# Patient Record
Sex: Female | Born: 1975 | Race: Black or African American | Hispanic: No | Smoking: Former smoker
Health system: Southern US, Community
[De-identification: ages and names within clinical notes are randomized; demographics above are authoritative.]

## PROBLEM LIST (undated history)

## (undated) DIAGNOSIS — I10 Essential (primary) hypertension: Secondary | ICD-10-CM

---

## 2018-12-07 ENCOUNTER — Emergency Department
Admission: EM | Admit: 2018-12-07 | Discharge: 2018-12-07 | Disposition: A | Discharge status: Home / Self Care | Payer: Federal, State, Local not specified - PPO | Attending: Emergency Medicine | Admitting: Emergency Medicine

## 2018-12-07 ENCOUNTER — Encounter: Payer: Self-pay | Admitting: Emergency Medicine

## 2018-12-07 ENCOUNTER — Other Ambulatory Visit: Payer: Self-pay

## 2018-12-07 DIAGNOSIS — E876 Hypokalemia: Secondary | ICD-10-CM | POA: Insufficient documentation

## 2018-12-07 DIAGNOSIS — R42 Dizziness and giddiness: Secondary | ICD-10-CM | POA: Insufficient documentation

## 2018-12-07 DIAGNOSIS — Z87891 Personal history of nicotine dependence: Secondary | ICD-10-CM | POA: Diagnosis not present

## 2018-12-07 DIAGNOSIS — I10 Essential (primary) hypertension: Secondary | ICD-10-CM | POA: Diagnosis not present

## 2018-12-07 HISTORY — DX: Essential (primary) hypertension: I10

## 2018-12-07 LAB — CBC WITH DIFFERENTIAL/PLATELET
Abs Immature Granulocytes: 0.02 10*3/uL (ref 0.00–0.07)
Basophils Absolute: 0.1 10*3/uL (ref 0.0–0.1)
Basophils Relative: 1 %
EOS PCT: 3 %
Eosinophils Absolute: 0.2 10*3/uL (ref 0.0–0.5)
HCT: 35.1 % — ABNORMAL LOW (ref 36.0–46.0)
Hemoglobin: 11.3 g/dL — ABNORMAL LOW (ref 12.0–15.0)
Immature Granulocytes: 0 %
Lymphocytes Relative: 37 %
Lymphs Abs: 2.7 10*3/uL (ref 0.7–4.0)
MCH: 28.5 pg (ref 26.0–34.0)
MCHC: 32.2 g/dL (ref 30.0–36.0)
MCV: 88.4 fL (ref 80.0–100.0)
Monocytes Absolute: 0.6 10*3/uL (ref 0.1–1.0)
Monocytes Relative: 8 %
Neutro Abs: 3.8 10*3/uL (ref 1.7–7.7)
Neutrophils Relative %: 51 %
Platelets: 231 10*3/uL (ref 150–400)
RBC: 3.97 MIL/uL (ref 3.87–5.11)
RDW: 14.4 % (ref 11.5–15.5)
WBC: 7.5 10*3/uL (ref 4.0–10.5)
nRBC: 0 % (ref 0.0–0.2)

## 2018-12-07 LAB — COMPREHENSIVE METABOLIC PANEL
ALBUMIN: 3.6 g/dL (ref 3.5–5.0)
ALT: 13 U/L (ref 0–44)
AST: 32 U/L (ref 15–41)
Alkaline Phosphatase: 82 U/L (ref 38–126)
Anion gap: 8 (ref 5–15)
BUN: 13 mg/dL (ref 6–20)
CHLORIDE: 100 mmol/L (ref 98–111)
CO2: 28 mmol/L (ref 22–32)
Calcium: 8.4 mg/dL — ABNORMAL LOW (ref 8.9–10.3)
Creatinine, Ser: 0.87 mg/dL (ref 0.44–1.00)
GFR calc non Af Amer: >60 mL/min (ref >60)
Glucose, Bld: 130 mg/dL — ABNORMAL HIGH (ref 70–99)
Potassium: 3.3 mmol/L — ABNORMAL LOW (ref 3.5–5.1)
SODIUM: 136 mmol/L (ref 135–145)
Total Bilirubin: 0.8 mg/dL (ref 0.3–1.2)
Total Protein: 7.1 g/dL (ref 6.5–8.1)

## 2018-12-07 LAB — TROPONIN I

## 2018-12-07 LAB — MAGNESIUM: Magnesium: 2.2 mg/dL (ref 1.7–2.4)

## 2018-12-07 MED ORDER — POTASSIUM CHLORIDE CRYS ER 20 MEQ PO TBCR
40.0000 meq | EXTENDED_RELEASE_TABLET | Freq: Once | ORAL | Status: AC
  Administered 2018-12-07: 40 meq via ORAL
  Filled 2018-12-07: qty 2

## 2018-12-07 NOTE — ED Provider Notes (Signed)
[facility] Emergency Department Provider Note  ____________________________________________   First MD Initiated Contact with Patient 12/07/18 [phone]     (approximate)  I have reviewed the triage vital signs and the nursing notes.   HISTORY  Chief Complaint Dizziness    HPI Melissa Wilcox is a 42 y.o. female with a history of hypertension and prior episodes of dizziness that she says are secondary to low potassium.  She presents tonight by EMS for the same.  She states that she got up to go to the bathroom and felt very lightheaded.  In the past when this is happened she has passed out and even lost control of her bladder during the "falling out" episode.  She has been seen by her regular doctor multiple times and was told that she needs to keep a close eye on her potassium.  She takes HCTZ and takes a daily potassium supplement of 20 mEq a day with sometimes it still gets low in spite of that.  She says that her symptoms are mild at this time but she did not wanted to get any worse.  She denies headache, neck pain, shortness of breath, nausea, vomiting, fever/chills, chest pain, and abdominal pain.  Nothing in particular makes her symptoms better or worse.  She does not feel off balance at this time and states that she actually feels much better than she did earlier.  She has had no numbness nor weakness in her extremities.  Past Medical History:  Diagnosis Date  . Hypertension     There are no active problems to display for this patient.   History reviewed. No pertinent surgical history.  Prior to Admission medications   Not on File    Allergies Patient has no known allergies.  No family history on file.  Social History Social History   Tobacco Use  . Smoking status: Former Games developer  . Smokeless tobacco: Never Used  Substance Use Topics  . Alcohol use: Not on file  . Drug use: Not on file    Review of Systems Constitutional: No  fever/chills Eyes: No visual changes. ENT: No sore throat. Cardiovascular: Denies chest pain. Respiratory: Denies shortness of breath. Gastrointestinal: No abdominal pain.  No nausea, no vomiting.  No diarrhea.  No constipation. Genitourinary: Negative for dysuria. Musculoskeletal: Negative for neck pain.  Negative for back pain. Integumentary: Negative for rash. Neurological: Lightheadedness, now resolved.  Negative for headaches, focal weakness or numbness.   ____________________________________________   PHYSICAL EXAM:  VITAL SIGNS: ED Triage Vitals [12/07/18 0144]  Enc Vitals Group     BP (!) 125/57     Pulse Rate 67     Resp 18     Temp 98 F (36.7 C)     Temp Source Oral     SpO2 100 %     Weight 98.9 kg (218 lb)     Height 1.702 m (5\' 7" )     Head Circumference      Peak Flow      Pain Score 0     Pain Loc      Pain Edu?      Excl. in GC?     Constitutional: Alert and oriented. Well appearing and in no acute distress. Eyes: Conjunctivae are normal.  Head: Atraumatic. Nose: No congestion/rhinnorhea. Mouth/Throat: Mucous membranes are moist. Neck: No stridor.  No meningeal signs.   Cardiovascular: Normal rate, regular rhythm. Good peripheral circulation. Grossly normal heart sounds. Respiratory: Normal respiratory effort.  No  retractions. Lungs CTAB. Gastrointestinal: Soft and nontender. No distention.  Musculoskeletal: No lower extremity tenderness nor edema. No gross deformities of extremities. Neurologic:  Normal speech and language. No gross focal neurologic deficits are appreciated.  Negative Romberg, no pronator drift, ambulatory without difficulty and with steady gait, normal finger-to-nose testing. Skin:  Skin is warm, dry and intact. No rash noted. Psychiatric: Mood and affect are normal. Speech and behavior are normal.  ____________________________________________   LABS (all labs ordered are listed, but only abnormal results are displayed)  Labs  Reviewed  CBC WITH DIFFERENTIAL/PLATELET - Abnormal; Notable for the following components:      Result Value   Hemoglobin 11.3 (*)    HCT 35.1 (*)    All other components within normal limits  COMPREHENSIVE METABOLIC PANEL - Abnormal; Notable for the following components:   Potassium 3.3 (*)    Glucose, Bld 130 (*)    Calcium 8.4 (*)    All other components within normal limits  TROPONIN I  MAGNESIUM   ____________________________________________  EKG  ED ECG REPORT I, Loleta Roseory Shonta Phillis, the attending physician, personally viewed and interpreted this ECG.  Date: 12/07/2018 EKG Time: 1:44 AM Rate: 61 Rhythm: normal sinus rhythm QRS Axis: normal Intervals: normal ST/T Wave abnormalities: Non-specific ST segment / T-wave changes, but no evidence of acute ischemia. Narrative Interpretation: no evidence of acute ischemia   ____________________________________________  RADIOLOGY   ED MD interpretation: No indication for imaging  Official radiology report(s): No results found.  ____________________________________________   PROCEDURES  Critical Care performed: No   Procedure(s) performed:   Procedures   ____________________________________________   INITIAL IMPRESSION / ASSESSMENT AND PLAN / ED COURSE  As part of my medical decision making, I reviewed the following data within the electronic MEDICAL RECORD NUMBER Nursing notes reviewed and incorporated, Labs reviewed , EKG interpreted  and Old chart reviewed    Differential diagnosis includes, but is not limited to, electrolyte abnormality, volume depletion, CVA/TIA, acute infection.  The patient is asymptomatic at this time and her symptoms were only mild earlier.  She was concerned because of her history and she is correct that her potassium is a little bit low.  Although I am uncertain that this is the cause of her transient lightheadedness, she is neurologically intact and has a reassuring exam overall.  She is  comfortable with the plan to replete her potassium with 40 mEq by mouth and will continue taking her regular regimen at home.  I encouraged her to follow-up with her PCP at the next available opportunity and gave my usual customary return precautions.  Of note her CBC and magnesium levels were normal.     ____________________________________________  FINAL CLINICAL IMPRESSION(S) / ED DIAGNOSES  Final diagnoses:  Dizziness  Hypokalemia     MEDICATIONS GIVEN DURING THIS VISIT:  Medications  potassium chloride SA (K-DUR,KLOR-CON) CR tablet 40 mEq (has no administration in time range)     ED Discharge Orders    None       Note:  This document was prepared using Dragon voice recognition software and may include unintentional dictation errors.    Loleta RoseForbach, Makalah Asberry, MD 12/07/18 [phone]

## 2018-12-07 NOTE — ED Triage Notes (Signed)
Pt to triage via w/c with no distress noted, brought in by EMS; pt reports onset dizziness PTA; st hx of same with hypokalemia; pt denies c/o at present

## 2018-12-07 NOTE — Discharge Instructions (Signed)
Your workup in the Emergency Department today was reassuring.  We did not find any specific abnormalities other than a slightly decreased potassium level of 3.3.  We provided an additional potassium supplement (40 meq by mouth) and encourage you to continue taking your regular medications and supplements.  Please follow up with the doctor(s) listed in these documents as recommended.  Return to the Emergency Department if you develop new or worsening symptoms that concern you.

## 2019-09-05 ENCOUNTER — Other Ambulatory Visit: Payer: Self-pay

## 2019-09-05 ENCOUNTER — Ambulatory Visit
Admission: EM | Admit: 2019-09-05 | Discharge: 2019-09-05 | Disposition: A | Discharge status: Home / Self Care | Payer: Federal, State, Local not specified - PPO

## 2019-09-05 DIAGNOSIS — S60559A Superficial foreign body of unspecified hand, initial encounter: Secondary | ICD-10-CM

## 2019-09-05 DIAGNOSIS — W458XXA Other foreign body or object entering through skin, initial encounter: Secondary | ICD-10-CM | POA: Diagnosis not present

## 2019-09-05 MED ORDER — LIDOCAINE HCL (PF) 1 % IJ SOLN
1.0000 mL | Freq: Once | INTRAMUSCULAR | Status: AC
  Administered 2019-09-05: 1 mL

## 2019-09-05 MED ORDER — LIDOCAINE-EPINEPHRINE-TETRACAINE (LET) SOLUTION
3.0000 mL | Freq: Once | NASAL | Status: AC
  Administered 2019-09-05: 3 mL via TOPICAL

## 2019-09-05 NOTE — ED Triage Notes (Signed)
Patient states that she was crawling up her steps and she got a splinter in her finger. Patient reports that she called EMS and they were unable to get it out.

## 2019-09-05 NOTE — ED Provider Notes (Signed)
[place], [place]   Name: Melissa Wilcox DOB: 09-27-1976 MRN: [id number] CSN: [id number] PCP: Johnston Ebbs, MD  Arrival date and time:  09/05/19 1945  Chief Complaint:  Foreign Body in Skin   NOTE: Prior to seeing the patient today, I have reviewed the triage nursing documentation and vital signs. Clinical staff has updated patient's PMH/PSHx, current medication list, and drug allergies/intolerances to ensure comprehensive history available to assist in medical decision making.   History:   HPI: Melissa Wilcox is a 43 y.o. female who presents today with complaints of a retained foreign body in her RIGHT hand. Patient advising that she was carrying her groceries up the steps while holding on to her wooden railing. Patient states, "I run my hand along that rail and got a tree stuck in my hand". Foreign body embedded to the webbing as the base of the 1st digit on her RIGHT hand. Patient reports that she called EMS out to the her to remove the splinter, however they were unable to. Patient states, "I called them to keep from going to the emergency room". Patient very anxious and screaming out in pain. She states, "when they couldn't get it out they told me to come over here". Tetanus vaccination status reviewed with patient. Based on her reports, it is determined that she is up to date on her tetanus prophylaxis.  Past Medical History:  Diagnosis Date  . Hypertension     History reviewed. No pertinent surgical history.  History reviewed. No pertinent family history.  Social History   Tobacco Use  . Smoking status: Former Research scientist (life sciences)  . Smokeless tobacco: Never Used  Substance Use Topics  . Alcohol use: Never    Frequency: Never  . Drug use: Never    There are no active problems to display for this patient.   Home Medications:    Current Meds  Medication Sig  . amLODipine (NORVASC) 5 MG tablet Take by mouth.  . levothyroxine (SYNTHROID) 50 MCG tablet Take by mouth.  .  lisdexamfetamine (VYVANSE) 50 MG capsule Take by mouth.  . spironolactone (ALDACTONE) 25 MG tablet Take 1 tablet by mouth once daily  . tiZANidine (ZANAFLEX) 2 MG tablet Take by mouth.  . valACYclovir (VALTREX) 1000 MG tablet Take by mouth.    Allergies:   Patient has no known allergies.  Review of Systems (ROS): Review of Systems  Constitutional: Negative for chills and fever.  Respiratory: Negative for cough and shortness of breath.   Cardiovascular: Negative for chest pain and palpitations.  Skin: Positive for wound.  Psychiatric/Behavioral: The patient is nervous/anxious.   All other systems reviewed and are negative.    Vital Signs: Today's Vitals   09/05/19 1954 09/05/19 1958 09/05/19 2026  BP:  139/80   Pulse:  68   Resp:  18   Temp:  97.8 F (36.6 C)   TempSrc:  Tympanic   SpO2:  100%   Weight: 220 lb (99.8 kg)    Height: 5\' 7"  (1.702 m)    PainSc: 10-Worst pain ever  0-No pain    Physical Exam: Physical Exam  Constitutional: She is oriented to person, place, and time and well-developed, well-nourished, and in no distress.  HENT:  Head: Normocephalic and atraumatic.  Cardiovascular: Normal rate.  Pulmonary/Chest: Effort normal. No respiratory distress.  Musculoskeletal:     Right hand: She exhibits tenderness. She exhibits normal range of motion, normal two-point discrimination, normal capillary refill, no deformity and no swelling. Normal sensation noted. Normal strength  noted.       Hands:  Neurological: She is alert and oriented to person, place, and time. She has normal sensation.  Skin: Skin is warm and dry. No rash noted.  Psychiatric: Memory, affect and judgment normal. Her mood appears anxious.  Nursing note and vitals reviewed.   Urgent Care Treatments / Results:   LABS: PLEASE NOTE: all labs that were ordered this encounter are listed, however only abnormal results are displayed. Labs Reviewed - No data to display  EKG: -None  RADIOLOGY:  No results found.  PROCEDURES: Foreign Body Removal Performed by: Verlee MonteGray, Anneliese Leblond E, NP Authorized by: Verlee MonteGray, Tangelia Sanson E, NP   Consent:    Consent obtained:  Verbal   Consent given by:  Patient   Risks discussed:  Bleeding, infection, pain and incomplete removal   Alternatives discussed:  Alternative treatment and referral Location:    Location:  Hand   Hand location:  R palm (webbing at base of 1st digit)   Depth:  Intradermal   Tendon involvement:  None Pre-procedure details:    Imaging:  None   Neurovascular status: intact   Anesthesia (see MAR for exact dosages):    Anesthesia method:  Topical application and local infiltration   Topical anesthetic:  LET (ineffective)   Local anesthetic:  Lidocaine 1% WITH epi Procedure type:    Procedure complexity:  Simple Procedure details:    Dissection of underlying tissues: no     Bloodless field: yes     Removal mechanism:  Hemostat   Foreign bodies recovered:  1   Description:  1 cm wooden shard   Intact foreign body removal: yes   Post-procedure details:    Neurovascular status: intact     Confirmation:  No additional foreign bodies on visualization   Skin closure:  None   Dressing:  Antibiotic ointment and adhesive bandage   Patient tolerance of procedure:  Tolerated well, no immediate complications    MEDICATIONS RECEIVED THIS VISIT: Medications  lidocaine-EPINEPHrine-tetracaine (LET) solution (3 mLs Topical Given 09/05/19 2001)  lidocaine (PF) (XYLOCAINE) 1 % injection 1 mL (1 mL Infiltration Given 09/05/19 2025)    PERTINENT CLINICAL COURSE NOTES/UPDATES:   Initial Impression / Assessment and Plan / Urgent Care Course:  Pertinent labs & imaging results that were available during my care of the patient were personally reviewed by me and considered in my medical decision making (see lab/imaging section of note for values and interpretations).  Melissa Wilcox is a 43 y.o. female who presents to [facility] today with  complaints of Foreign Body in Skin   Patient is well appearing overall in clinic today. She does not appear to be in any acute distress. Presenting symptoms (see HPI) and exam as documented above. Patient presents very anxious. We initially had difficulties getting her to allow topical anesthetic to be applied. LET solution applied and allowed to take effect. Let removed and patient advising that she did not feel numb enough to proceed. Patient on the phone with her family citing that she needed someone to talk to through the procedure. Discussed addition of intradermal anesthetic and patient agreed. 1% lidocaine infiltrated into tissue surrounding foreign body. While patient talking on the phone, FB removed. Patient states, "now tell me when you are going to do it". Wound cleansed. There are no additional FBs observed; patient denies pain. Bacitracin applied and wound dressed by nursing. Wound superficial and not felt to require antibiotics at this point. Patient to monitor for signs and  symptoms of infection, which would include increased redness, swelling, streaking, drainage, pain, and the development of a fever. She was advised to keep wound clean and dry, and to apply TAO twice a day for the next few days.   Discussed follow up with primary care physician in 1 week for re-evaluation. I have reviewed the follow up and strict return precautions for any new or worsening symptoms. Patient is aware of symptoms that would be deemed urgent/emergent, and would thus require further evaluation either here or in the emergency department. At the time of discharge, she verbalized understanding and consent with the discharge plan as it was reviewed with her. All questions were fielded by provider and/or clinic staff prior to patient discharge.    Final Clinical Impressions / Urgent Care Diagnoses:   Final diagnoses:  Foreign body of skin of hand, initial encounter    New Prescriptions:  [place] Controlled Substance  Registry consulted? Not Applicable  Meds ordered this encounter  Medications  . lidocaine-EPINEPHrine-tetracaine (LET) solution  . lidocaine (PF) (XYLOCAINE) 1 % injection 1 mL    Recommended Follow up Care:  Patient encouraged to follow up with the following provider within the specified time frame, or sooner as dictated by the severity of her symptoms. As always, she was instructed that for any urgent/emergent care needs, she should seek care either here or in the emergency department for more immediate evaluation.  Follow-up Information    Chetty, Geryl Rankins, MD In 1 week.   Specialty: Family Medicine Why: General reassessment of symptoms if not improving Contact information: [address] [facility] [place] [address] [phone]         NOTE: This note was prepared using Dragon dictation software along with smaller phrase technology. Despite my best ability to proofread, there is the potential that transcriptional errors may still occur from this process, and are completely unintentional.     Verlee Monte, NP 09/05/19 2126

## 2019-09-05 NOTE — Discharge Instructions (Signed)
It was very nice seeing you today in clinic. Thank you for entrusting me with your care.   Monitor for signs and symptoms of infection, which would include increased redness, swelling, streaking, drainage, pain, and the development of a fever. Apply antibiotic ointment daily. Call clinic with any concerns.   If your symptoms/condition worsens, please seek follow up care either here or in the ER. Please remember, our [facility] providers are "right here with you" when you need [place].   Again, it was my pleasure to take care of you today. Thank you for choosing our clinic. I hope that you start to feel better quickly.   Honor Loh, MSN, APRN, FNP-C, CEN Advanced Practice Provider [facility]

## 2020-05-06 ENCOUNTER — Encounter: Payer: Self-pay | Admitting: Emergency Medicine

## 2020-05-06 ENCOUNTER — Ambulatory Visit
Admission: EM | Admit: 2020-05-06 | Discharge: 2020-05-06 | Disposition: A | Discharge status: Home / Self Care | Payer: Federal, State, Local not specified - PPO | Attending: Family Medicine | Admitting: Family Medicine

## 2020-05-06 ENCOUNTER — Other Ambulatory Visit: Payer: Self-pay

## 2020-05-06 DIAGNOSIS — T50Z95A Adverse effect of other vaccines and biological substances, initial encounter: Secondary | ICD-10-CM

## 2020-05-06 DIAGNOSIS — M79602 Pain in left arm: Secondary | ICD-10-CM

## 2020-05-06 MED ORDER — DOXYCYCLINE HYCLATE 100 MG PO CAPS: 100.0000 mg | ORAL_CAPSULE | Freq: Two times a day (BID) | ORAL | 0 refills | Status: AC

## 2020-05-06 MED ORDER — PREDNISONE 10 MG PO TABS: ORAL_TABLET | ORAL | 0 refills | Status: DC

## 2020-05-06 NOTE — ED Provider Notes (Signed)
[facility] ____________________________________________  Time seen: Approximately 7:40 PM  I have reviewed the triage vital signs and the nursing notes.   HISTORY  Chief Complaint Arm Pain (left arm)  HPI Melissa Wilcox is a 44 y.o. female presenting for evaluation of left arm soreness after vaccine.  Patient reports she received the first Pfizer COVID-19 vaccine this past Thursday evening.  States that night she had the normal postimmunization arm soreness and also did have some diffuse body aches which she felt was consistent with with her vaccine side effects.  Reports the next day she began having soreness to her left arm that has continued.  Denies decreased range of motion, swelling, paresthesias, pain radiation.  States it is a aching pain that is worse with activity.  States that she does have lot of lifting and movement work daily at work and requests work note.  Denies fevers, chest pain or shortness of breath, vomiting, recent sickness.  Denies aggravating or alleviating factors.  Chetty, Geryl Rankins, MD : PCP   Past Medical History:  Diagnosis Date   Hypertension     There are no problems to display for this patient.   History reviewed. No pertinent surgical history.   No current facility-administered medications for this encounter.  Current Outpatient Medications:    amLODipine (NORVASC) 5 MG tablet, Take by mouth., Disp: , Rfl:    levothyroxine (SYNTHROID) 50 MCG tablet, Take by mouth., Disp: , Rfl:    lisdexamfetamine (VYVANSE) 50 MG capsule, Take by mouth., Disp: , Rfl:    spironolactone (ALDACTONE) 25 MG tablet, Take 1 tablet by mouth once daily, Disp: , Rfl:    valACYclovir (VALTREX) 1000 MG tablet, Take by mouth., Disp: , Rfl:    doxycycline (VIBRAMYCIN) 100 MG capsule, Take 1 capsule (100 mg total) by mouth 2 (two) times daily for 7 days., Disp: 14 capsule, Rfl: 0   predniSONE (DELTASONE) 10 MG tablet, Start 60 mg po day one, then 50 mg  po day two, taper by 10 mg daily until complete., Disp: 21 tablet, Rfl: 0  Allergies Patient has no known allergies.  History reviewed. No pertinent family history.  Social History Social History   Tobacco Use   Smoking status: Former Smoker   Smokeless tobacco: Never Used  Substance Use Topics   Alcohol use: Never   Drug use: Never    Review of Systems Constitutional: No fever ENT: No sore throat. Cardiovascular: Denies chest pain. Respiratory: Denies shortness of breath. Gastrointestinal: No abdominal pain.  No nausea, no vomiting. Musculoskeletal: Positive left arm pain. Skin: Negative for rash. Neurological: Negative for headaches, focal weakness or numbness.   ____________________________________________   PHYSICAL EXAM:  VITAL SIGNS: ED Triage Vitals  Enc Vitals Group     BP 05/06/20 1847 123/75     Pulse Rate 05/06/20 1847 71     Resp 05/06/20 1847 18     Temp 05/06/20 1847 98.1 F (36.7 C)     Temp Source 05/06/20 1847 Temporal     SpO2 05/06/20 1847 100 %     Weight 05/06/20 1844 212 lb (96.2 kg)     Height 05/06/20 1844 5\' 7"  (1.702 m)     Head Circumference --      Peak Flow --      Pain Score 05/06/20 1844 8     Pain Loc --      Pain Edu? --      Excl. in GC? --     Constitutional: Alert  and oriented. Well appearing and in no acute distress. Eyes: Conjunctivae are normal.  ENT      Head: Normocephalic and atraumatic. Cardiovascular: Normal rate, regular rhythm. Grossly normal heart sounds.  Good peripheral circulation. Respiratory: Normal respiratory effort without tachypnea nor retractions. Breath sounds are clear and equal bilaterally. No wheezes, rales, rhonchi. Musculoskeletal:  Steady gait.  Bilateral distal radial pulses equal and easily palpated.  except: Left proximal arm deltoid tenderness to palpation with mild warmth to touch, no point bony tenderness, mild erythema at injection site, no drainage, forage motion present to  shoulder and arm, no edema.  No distal arm tenderness. Neurologic:  Normal speech and language. Speech is normal. No gait instability.  Skin:  Skin is warm, dry and intact. No rash noted. Psychiatric: Mood and affect are normal. Speech and behavior are normal. Patient exhibits appropriate insight and judgment   ___________________________________________   LABS (all labs ordered are listed, but only abnormal results are displayed)  Labs Reviewed - No data to display ____________________________________________   PROCEDURES Procedures    INITIAL IMPRESSION / ASSESSMENT AND PLAN / ED COURSE  Pertinent labs & imaging results that were available during my care of the patient were reviewed by me and considered in my medical decision making (see chart for details).  Well-appearing patient.  No acute distress.  Left arm pain at injection site, suspect local reaction, concern for cellulitis.  Will treat with prednisone and doxycycline.  Encourage cool compresses, elevation and monitoring.  Discussed follow-up and return parameters.Discussed indication, risks and benefits of medications with patient.   Discussed follow up and return parameters including no resolution or any worsening concerns. Patient verbalized understanding and agreed to plan.   ____________________________________________   FINAL CLINICAL IMPRESSION(S) / ED DIAGNOSES  Final diagnoses:  Vaccine reaction, initial encounter  Left arm pain     ED Discharge Orders         Ordered    predniSONE (DELTASONE) 10 MG tablet     05/06/20 1920    doxycycline (VIBRAMYCIN) 100 MG capsule  2 times daily     05/06/20 1920           Note: This dictation was prepared with Dragon dictation along with smaller phrase technology. Any transcriptional errors that result from this process are unintentional.         Marylene Land, NP 05/06/20 2020

## 2020-05-06 NOTE — ED Triage Notes (Signed)
Patient states she received the Pfizer vaccine on Thursday in her left arm. She is c/o left arm pain and left side pain that started on Friday. 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

## 2020-05-06 NOTE — Discharge Instructions (Addendum)
Take medication as prescribed. Monitor.  °Follow up with your primary care physician this week as needed. Return to Urgent care for new or worsening concerns.  ° °

## 2021-03-26 ENCOUNTER — Other Ambulatory Visit: Payer: Self-pay

## 2021-03-26 ENCOUNTER — Ambulatory Visit (INDEPENDENT_AMBULATORY_CARE_PROVIDER_SITE_OTHER)
Admit: 2021-03-26 | Discharge: 2021-03-26 | Disposition: A | Discharge status: Home / Self Care | Payer: Federal, State, Local not specified - PPO | Attending: Sports Medicine | Admitting: Sports Medicine

## 2021-03-26 ENCOUNTER — Ambulatory Visit
Admission: EM | Admit: 2021-03-26 | Discharge: 2021-03-26 | Disposition: A | Discharge status: Home / Self Care | Payer: Federal, State, Local not specified - PPO | Attending: Sports Medicine | Admitting: Sports Medicine

## 2021-03-26 DIAGNOSIS — M791 Myalgia, unspecified site: Secondary | ICD-10-CM | POA: Diagnosis not present

## 2021-03-26 DIAGNOSIS — B0229 Other postherpetic nervous system involvement: Secondary | ICD-10-CM | POA: Diagnosis not present

## 2021-03-26 DIAGNOSIS — G4489 Other headache syndrome: Secondary | ICD-10-CM

## 2021-03-26 DIAGNOSIS — H53141 Visual discomfort, right eye: Secondary | ICD-10-CM | POA: Diagnosis not present

## 2021-03-26 DIAGNOSIS — R519 Headache, unspecified: Secondary | ICD-10-CM | POA: Diagnosis not present

## 2021-03-26 MED ORDER — VALACYCLOVIR HCL 1 G PO TABS: 1000.0000 mg | ORAL_TABLET | Freq: Three times a day (TID) | ORAL | 0 refills | Status: AC

## 2021-03-26 MED ORDER — PREDNISONE 10 MG (21) PO TBPK: ORAL_TABLET | Freq: Every day | ORAL | 0 refills | Status: DC

## 2021-03-26 NOTE — ED Triage Notes (Signed)
Patient states that she has been having a headache since yesterday, reports that she is also having body aches and back pain.

## 2021-03-26 NOTE — Discharge Instructions (Addendum)
Your CT scan was read as normal.  You have no masses, bleeds, or acute intracranial abnormality. I am going to treat you for a potential early shingles infection.  I have prescribed a steroid Dosepak.  I have also prescribed an antiviral medication for possible shingles. I want you to follow-up with your primary care physician in the next 1 to 2 days.  Please call tomorrow for an appointment.  I gave you a work note keeping her out of work tomorrow and the next day. If your symptoms worsen in any way I want you to call 911 or go to the emergency room. I have given you an educational handouts.  I hope you get to feeling better, Dr. Zachery Dauer

## 2021-03-27 NOTE — ED Provider Notes (Signed)
[facility]    CSN: [id number] Arrival date & time: 03/26/21  1632      History   Chief Complaint Chief Complaint  Patient presents with  . Headache    HPI Melissa Wilcox is a 45 y.o. female.   Patient is a pleasant 45 year old female who presents for evaluation of the above issues.  Normally sees [facility] but was unable to get into see them today.  She works at the [employer].  She reports having headache, neck pain, and some generalized myalgias since yesterday.  Her headache is felt like is pressure over the right side of her head going into the posterior aspect of her neck.  She denies vision changes, jaw pain, diaphoresis, sore throat, chest pain or shortness of breath.  She has no history of MI or CVA or significant vascular history.  She denies any ear pain.  No nausea vomiting or diarrhea.  No documented fever shakes or chills.  She does have some body aches.  Overall she is concerned about the headache which is on the top of her head on the right side going into her neck.  She says when she touches her head it hurts.  She denies any rash.  No red flag signs or symptoms appreciated on history.  She has vaccinated against COVID x2, she also has received her flu shot.  No COVID history of Covid exposure.     Past Medical History:  Diagnosis Date  . Hypertension     There are no problems to display for this patient.   History reviewed. No pertinent surgical history.  OB History   No obstetric history on file.      Home Medications    Prior to Admission medications   Medication Sig Start Date End Date Taking? Authorizing Provider  amLODipine (NORVASC) 5 MG tablet Take by mouth. 11/30/18  Yes [provider]  FLUoxetine (PROZAC) 40 MG capsule Take 40 mg by mouth daily. 03/21/21  Yes [provider]  levothyroxine (SYNTHROID) 50 MCG tablet Take by mouth. 11/30/18  Yes [provider]  lisdexamfetamine (VYVANSE) 50 MG  capsule Take by mouth. 08/10/19  Yes [provider]  metroNIDAZOLE (FLAGYL) 500 MG tablet Take 500 mg by mouth 2 (two) times daily. 03/24/21  Yes [provider]  predniSONE (STERAPRED UNI-PAK 21 TAB) 10 MG (21) TBPK tablet Take by mouth daily. Take 6 tabs by mouth daily  for 2 days, then 5 tabs for 2 days, then 4 tabs for 2 days, then 3 tabs for 2 days, 2 tabs for 2 days, then 1 tab by mouth daily for 2 days 03/26/21  Yes Delton See, MD  spironolactone (ALDACTONE) 25 MG tablet Take 1 tablet by mouth once daily 07/27/19  Yes [provider]  terconazole (TERAZOL 3) 0.8 % vaginal cream Place 1 applicator vaginally at bedtime. 03/24/21  Yes [provider]  valACYclovir (VALTREX) 1000 MG tablet Take 1 tablet (1,000 mg total) by mouth 3 (three) times daily. 03/26/21  Yes Delton See, MD    Family History History reviewed. No pertinent family history.  Social History Social History   Tobacco Use  . Smoking status: Former Games developer  . Smokeless tobacco: Never Used  Vaping Use  . Vaping Use: Never used  Substance Use Topics  . Alcohol use: Never  . Drug use: Never     Allergies   Patient has no known allergies.   Review of Systems Review of Systems  Constitutional:  Negative for activity change, appetite change, chills, diaphoresis, fatigue and fever.  HENT: Negative.  Negative for congestion, ear discharge, ear pain, sinus pressure and sinus pain.   Eyes: Positive for photophobia and pain. Negative for discharge, redness and visual disturbance.  Respiratory: Negative.  Negative for cough, chest tightness, shortness of breath, wheezing and stridor.   Cardiovascular: Negative for chest pain and palpitations.  Gastrointestinal: Negative.  Negative for abdominal pain.  Genitourinary: Negative.  Negative for dysuria.  Musculoskeletal: Positive for myalgias and neck pain. Negative for arthralgias, back pain, joint swelling and neck stiffness.  Skin:  Negative.  Negative for color change, pallor, rash and wound.  Neurological: Positive for headaches. Negative for dizziness, tremors, seizures, syncope, facial asymmetry, speech difficulty, weakness, light-headedness and numbness.  All other systems reviewed and are negative.    Physical Exam Triage Vital Signs ED Triage Vitals  Enc Vitals Group     BP 03/26/21 1710 127/70     Pulse Rate 03/26/21 1710 60     Resp 03/26/21 1710 18     Temp 03/26/21 1710 (!) 97 F (36.1 C)     Temp Source 03/26/21 1710 Tympanic     SpO2 03/26/21 1710 100 %     Weight 03/26/21 1708 248 lb (112.5 kg)     Height 03/26/21 1708 5' 5.75" (1.67 m)     Head Circumference --      Peak Flow --      Pain Score 03/26/21 1708 10     Pain Loc --      Pain Edu? --      Excl. in GC? --    No data found.  Updated Vital Signs BP 127/70 (BP Location: Right Arm)   Pulse 60   Temp (!) 97 F (36.1 C) (Tympanic)   Resp 18   Ht 5' 5.75" (1.67 m)   Wt 112.5 kg   LMP 03/25/2021   SpO2 100%   BMI 40.34 kg/m   Visual Acuity Right Eye Distance:   Left Eye Distance:   Bilateral Distance:    Right Eye Near:   Left Eye Near:    Bilateral Near:     Physical Exam Vitals and nursing note reviewed.  Constitutional:      General: She is not in acute distress.    Appearance: She is well-developed. She is not ill-appearing, toxic-appearing or diaphoretic.     Comments: Uncomfortable appearing  HENT:     Head: Normocephalic and atraumatic.     Comments: Very TTP right side of the head over the temporal and parietal bones    Mouth/Throat:     Mouth: Mucous membranes are moist.  Eyes:     General: No visual field deficit or scleral icterus.    Extraocular Movements: Extraocular movements intact.     Right eye: Nystagmus present. Normal extraocular motion.     Left eye: Normal extraocular motion and no nystagmus.     Pupils: Pupils are equal, round, and reactive to light. Pupils are equal.     Right eye: Pupil  is round and reactive.     Left eye: Pupil is round and reactive.  Cardiovascular:     Rate and Rhythm: Normal rate and regular rhythm.     Heart sounds: Normal heart sounds. No murmur heard. No friction rub. No gallop.   Pulmonary:     Effort: Pulmonary effort is normal. No respiratory distress.     Breath sounds: Normal breath sounds. No stridor. No wheezing,  rhonchi or rales.  Musculoskeletal:     Cervical back: Normal range of motion and neck supple. No rigidity.  Skin:    General: Skin is warm.     Capillary Refill: Capillary refill takes less than 2 seconds.     Coloration: Skin is not cyanotic.     Findings: No erythema or rash.  Neurological:     General: No focal deficit present.     Mental Status: She is alert and oriented to person, place, and time.     GCS: GCS eye subscore is 4. GCS verbal subscore is 5. GCS motor subscore is 6.     Cranial Nerves: No cranial nerve deficit, dysarthria or facial asymmetry.     Sensory: Sensory deficit present.     Motor: No weakness.     Coordination: Coordination is intact. Coordination normal.     Gait: Gait is intact. Gait normal.     Deep Tendon Reflexes: Reflexes normal. Babinski sign absent on the right side. Babinski sign absent on the left side.     Comments: She reports some paresthesia over the right side of her head going into her neck.  It is tender to palpation.  I do not appreciate any rash.  Otherwise her neurological exam is grossly nonfocal.      UC Treatments / Results  Labs (all labs ordered are listed, but only abnormal results are displayed) Labs Reviewed - No data to display  EKG   Radiology CT Head Wo Contrast  Result Date: 03/26/2021 CLINICAL DATA:  Headache, new or worsening, neuro deficit. Additional history provided: Patient reports right-sided severe headache for 2 days. EXAM: CT HEAD WITHOUT CONTRAST TECHNIQUE: Contiguous axial images were obtained from the base of the skull through the vertex without  intravenous contrast. COMPARISON:  No pertinent prior exams available for comparison. FINDINGS: Brain: Cerebral volume is normal. There is no acute intracranial hemorrhage. No demarcated cortical infarct. No extra-axial fluid collection. No evidence of intracranial mass. No midline shift. Partially empty sella turcica. Vascular: No hyperdense vessel. Skull: Normal. Negative for fracture or focal lesion. Sinuses/Orbits: Visualized orbits show no acute finding. Tiny left ethmoid sinus osteoma. No significant paranasal sinus disease at the imaged levels. IMPRESSION: No evidence of acute intracranial abnormality. Partially empty sella turcica. This finding is very commonly incidental, but can be associated with idiopathic intracranial hypertension. Electronically Signed   By: Jackey LogeKyle  Golden DO   On: 03/26/2021 18:54    Procedures Procedures (including critical care time)  Medications Ordered in UC Medications - No data to display  Initial Impression / Assessment and Plan / UC Course  I have reviewed the triage vital signs and the nursing notes.  Pertinent labs & imaging results that were available during my care of the patient were reviewed by me and considered in my medical decision making (see chart for details).  Clinical impression: 1.  Headache on the right side since yesterday.  It is persistent.  Examination is grossly nonfocal. 2.  She has some mild photophobia in the right eye with myalgias.  Overall her symptoms are consistent with postherpetic nervous system involvement.  The pain is preceding the rash.  That said cannot fully rule out an intracranial process.  Treatment plan: 1.  The findings and treatment plan were discussed in detail with the patient.  Patient was in agreement. 2.  Recommended we get a CT scan of her head.  Results are above.  No intracranial process noted.  There is a partially  empty sella turcica.  Per radiology that could be consistent with idiopathic intracranial  hypertension.  I have advised the patient to follow-up with her primary care provider to see if any further work-up or referral is needed. 3.  We will get a go and treat her for presumed shingles.  Gave her Valtrex 1000 mg 3 times daily for a week. 4.  Also give her prednisone taper. 5.  Educational handouts provided. 6.  She requested a work note.  We will just keep her out a few days and she can to return to work on Monday, April 4 assuming that she is feeling better. 7.  If symptoms persist she should see her primary care provider.  I have asked her to follow-up in the next day or 2 for recheck.  She will call them tomorrow.  Certainly if they worsen in any way she should go to the emergency room and should call 911.  She voiced verbal understanding. 8.  Follow-up here as needed.    Final Clinical Impressions(s) / UC Diagnoses   Final diagnoses:  Other headache syndrome  Other postherpetic nervous system involvement  Photophobia, right eye  Myalgia     Discharge Instructions     Your CT scan was read as normal.  You have no masses, bleeds, or acute intracranial abnormality. I am going to treat you for a potential early shingles infection.  I have prescribed a steroid Dosepak.  I have also prescribed an antiviral medication for possible shingles. I want you to follow-up with your primary care physician in the next 1 to 2 days.  Please call tomorrow for an appointment.  I gave you a work note keeping her out of work tomorrow and the next day. If your symptoms worsen in any way I want you to call 911 or go to the emergency room. I have given you an educational handouts.  I hope you get to feeling better, Dr. Zachery Dauer   ED Prescriptions    Medication Sig Dispense Auth. Provider   valACYclovir (VALTREX) 1000 MG tablet Take 1 tablet (1,000 mg total) by mouth 3 (three) times daily. 21 tablet Delton See, MD   predniSONE (STERAPRED UNI-PAK 21 TAB) 10 MG (21) TBPK tablet Take by mouth  daily. Take 6 tabs by mouth daily  for 2 days, then 5 tabs for 2 days, then 4 tabs for 2 days, then 3 tabs for 2 days, 2 tabs for 2 days, then 1 tab by mouth daily for 2 days 42 tablet Delton See, MD     PDMP not reviewed this encounter.   Delton See, MD 03/31/21 1019

## 2021-07-26 ENCOUNTER — Other Ambulatory Visit: Payer: Self-pay

## 2021-07-26 ENCOUNTER — Emergency Department
Admission: EM | Admit: 2021-07-26 | Discharge: 2021-07-26 | Disposition: A | Discharge status: Home / Self Care | Payer: Federal, State, Local not specified - PPO | Attending: Emergency Medicine | Admitting: Emergency Medicine

## 2021-07-26 ENCOUNTER — Emergency Department: Payer: Federal, State, Local not specified - PPO

## 2021-07-26 DIAGNOSIS — M791 Myalgia, unspecified site: Secondary | ICD-10-CM | POA: Insufficient documentation

## 2021-07-26 DIAGNOSIS — R059 Cough, unspecified: Secondary | ICD-10-CM | POA: Insufficient documentation

## 2021-07-26 DIAGNOSIS — Z5321 Procedure and treatment not carried out due to patient leaving prior to being seen by health care provider: Secondary | ICD-10-CM | POA: Insufficient documentation

## 2021-07-26 DIAGNOSIS — R110 Nausea: Secondary | ICD-10-CM | POA: Diagnosis not present

## 2021-07-26 DIAGNOSIS — R509 Fever, unspecified: Secondary | ICD-10-CM | POA: Diagnosis not present

## 2021-07-26 DIAGNOSIS — U071 COVID-19: Secondary | ICD-10-CM

## 2021-07-26 DIAGNOSIS — H53149 Visual discomfort, unspecified: Secondary | ICD-10-CM | POA: Insufficient documentation

## 2021-07-26 HISTORY — DX: COVID-19: U07.1

## 2021-07-26 LAB — CBC WITH DIFFERENTIAL/PLATELET
Abs Immature Granulocytes: 0.01 10*3/uL (ref 0.00–0.07)
Basophils Absolute: 0.1 10*3/uL (ref 0.0–0.1)
Basophils Relative: 1 %
Eosinophils Absolute: 0.1 10*3/uL (ref 0.0–0.5)
Eosinophils Relative: 1 %
HCT: 34.9 % — ABNORMAL LOW (ref 36.0–46.0)
Hemoglobin: 11.3 g/dL — ABNORMAL LOW (ref 12.0–15.0)
Immature Granulocytes: 0 %
Lymphocytes Relative: 17 %
Lymphs Abs: 1 10*3/uL (ref 0.7–4.0)
MCH: 29.9 pg (ref 26.0–34.0)
MCHC: 32.4 g/dL (ref 30.0–36.0)
MCV: 92.3 fL (ref 80.0–100.0)
Monocytes Absolute: 0.6 10*3/uL (ref 0.1–1.0)
Monocytes Relative: 10 %
Neutro Abs: 4 10*3/uL (ref 1.7–7.7)
Neutrophils Relative %: 71 %
Platelets: 252 10*3/uL (ref 150–400)
RBC: 3.78 MIL/uL — ABNORMAL LOW (ref 3.87–5.11)
RDW: 13.9 % (ref 11.5–15.5)
WBC: 5.6 10*3/uL (ref 4.0–10.5)
nRBC: 0 % (ref 0.0–0.2)

## 2021-07-26 LAB — COMPREHENSIVE METABOLIC PANEL
ALT: 14 U/L (ref 0–44)
AST: 20 U/L (ref 15–41)
Albumin: 3.8 g/dL (ref 3.5–5.0)
Alkaline Phosphatase: 77 U/L (ref 38–126)
Anion gap: 6 (ref 5–15)
BUN: 12 mg/dL (ref 6–20)
CO2: 26 mmol/L (ref 22–32)
Calcium: 8.8 mg/dL — ABNORMAL LOW (ref 8.9–10.3)
Chloride: 102 mmol/L (ref 98–111)
Creatinine, Ser: 0.88 mg/dL (ref 0.44–1.00)
GFR, Estimated: >60 mL/min (ref >60)
Glucose, Bld: 110 mg/dL — ABNORMAL HIGH (ref 70–99)
Potassium: 3.6 mmol/L (ref 3.5–5.1)
Sodium: 134 mmol/L — ABNORMAL LOW (ref 135–145)
Total Bilirubin: 0.5 mg/dL (ref 0.3–1.2)
Total Protein: 7.6 g/dL (ref 6.5–8.1)

## 2021-07-26 NOTE — ED Triage Notes (Signed)
Pt with body aches, headache, photophobia, fevfer today. Pt with strong cough noted. Pt denies sore throat, pt states does have nausea.

## 2021-07-26 NOTE — ED Notes (Signed)
Pt notified screener of her departure.  Pt in no distress at when leaving.

## 2021-08-08 ENCOUNTER — Ambulatory Visit
Admission: EM | Admit: 2021-08-08 | Discharge: 2021-08-08 | Disposition: A | Discharge status: Home / Self Care | Payer: Federal, State, Local not specified - PPO | Attending: Family Medicine | Admitting: Family Medicine

## 2021-08-08 ENCOUNTER — Other Ambulatory Visit: Payer: Self-pay

## 2021-08-08 DIAGNOSIS — G43919 Migraine, unspecified, intractable, without status migrainosus: Secondary | ICD-10-CM | POA: Diagnosis not present

## 2021-08-08 MED ORDER — SUMATRIPTAN SUCCINATE 100 MG PO TABS: ORAL_TABLET | ORAL | 0 refills | Status: DC

## 2021-08-08 MED ORDER — SUMATRIPTAN SUCCINATE 100 MG PO TABS: ORAL_TABLET | ORAL | 0 refills | Status: AC

## 2021-08-08 MED ORDER — PROMETHAZINE-DM 6.25-15 MG/5ML PO SYRP: 5.0000 mL | ORAL_SOLUTION | Freq: Four times a day (QID) | ORAL | 0 refills | Status: AC | PRN

## 2021-08-08 MED ORDER — KETOROLAC TROMETHAMINE 60 MG/2ML IM SOLN
60.0000 mg | Freq: Once | INTRAMUSCULAR | Status: AC
  Administered 2021-08-08: 60 mg via INTRAMUSCULAR

## 2021-08-08 NOTE — ED Triage Notes (Signed)
Pt c/o headache for several days. Pt was recently positive for COVID 07/26/21. Pt reports photo and auditory sensitivity. Pt states she has been to 4 different hospitals since being diagnosed with COVID. Pt was given cough medicine with codeine, so she is concerned this may have caused her headache. Pt denies any history of migraines.

## 2021-08-08 NOTE — Discharge Instructions (Signed)
This is most consistent with migraine headache.  I would recommend that you see an ophthalmologist in the near future to examine your eyes to assess for signs of pseudotumor cerebri.   Medication as prescribed.  Follow up with your PCP  Take care  Dr. Adriana Simas

## 2021-08-08 NOTE — ED Provider Notes (Signed)
[facility]    CSN: [id number] Arrival date & time: 08/08/21  1001      History   Chief Complaint Chief Complaint  Patient presents with   Headache    HPI 45 year old female presents with the above complaint.  3-day history of headache.  Located on the left side of the frontal region and temporal region.  Occasional nausea.  Associated photophobia and phonophobia.  Patient states that she recently took cough medicine with codeine and thought this may be causing the headache.  She has since stopped and headache has not resolved.  No fever.  She has had a history of prior headaches but no documented history of migraine.  She had a CT scan earlier in the year which revealed no acute process.  Denies any visual field changes, blurry vision.  She rates her pain as 10/10 in severity.  However, she is laughing and does not appear to be in any distress.  Home Medications    Prior to Admission medications   Medication Sig Start Date End Date Taking? Authorizing Provider  amLODipine (NORVASC) 5 MG tablet Take by mouth. 08/08/21  Yes [provider]  busPIRone (BUSPAR) 10 MG tablet Take by mouth. 01/01/21 01/01/22 Yes [provider]  FLUoxetine (PROZAC) 40 MG capsule Take 40 mg by mouth daily. 03/21/21  Yes [provider]  levothyroxine (SYNTHROID) 50 MCG tablet Take by mouth. 08/08/21  Yes [provider]  lisdexamfetamine (VYVANSE) 50 MG capsule Take by mouth. 08/10/19  Yes [provider]  promethazine-dextromethorphan (PROMETHAZINE-DM) 6.25-15 MG/5ML syrup Take 5 mLs by mouth 4 (four) times daily as needed for cough. 08/08/21  Yes Leta Bucklin G, DO  spironolactone (ALDACTONE) 25 MG tablet Take 1 tablet by mouth once daily 07/27/19  Yes [provider]  tiZANidine (ZANAFLEX) 4 MG tablet Take 4 mg by mouth at bedtime as needed. 05/16/21  Yes [provider]  valACYclovir (VALTREX) 1000 MG tablet Take 1 tablet (1,000 mg total)  by mouth 3 (three) times daily. 03/26/21  Yes Delton See, MD  SUMAtriptan (IMITREX) 100 MG tablet Take at the onset of headache. May take additional dose in 2 hours if headache persists or recurs. 08/08/21   Tommie Sams, DO   Social History Social History   Tobacco Use   Smoking status: Former   Smokeless tobacco: Never  Building services engineer Use: Never used  Substance Use Topics   Alcohol use: Never   Drug use: Never     Allergies   Patient has no known allergies.   Review of Systems Review of Systems  Eyes:  Positive for photophobia.  Respiratory:  Positive for cough.   Neurological:  Positive for headaches.    Physical Exam Triage Vital Signs ED Triage Vitals  Enc Vitals Group     BP 08/08/21 1024 (!) 152/79     Pulse Rate 08/08/21 1024 89     Resp 08/08/21 1024 18     Temp 08/08/21 1024 99.4 F (37.4 C)     Temp Source 08/08/21 1024 Oral     SpO2 08/08/21 1024 97 %     Weight 08/08/21 1020 250 lb (113.4 kg)     Height 08/08/21 1020 5' 5.75" (1.67 m)     Head Circumference --      Peak Flow --      Pain Score 08/08/21 1020 10     Pain Loc --      Pain Edu? --  Excl. in GC? --    Updated Vital Signs BP (!) 152/79 (BP Location: Left Arm)   Pulse 89   Temp 99.4 F (37.4 C) (Oral)   Resp 18   Ht 5' 5.75" (1.67 m)   Wt 113.4 kg   LMP 07/18/2021   SpO2 97%   BMI 40.66 kg/m   Visual Acuity Right Eye Distance:   Left Eye Distance:   Bilateral Distance:    Right Eye Near:   Left Eye Near:    Bilateral Near:     Physical Exam Vitals and nursing note reviewed.  Constitutional:      General: She is not in acute distress.    Appearance: Normal appearance. She is obese. She is not ill-appearing.  HENT:     Head: Normocephalic and atraumatic.  Eyes:     Conjunctiva/sclera: Conjunctivae normal.     Pupils: Pupils are equal, round, and reactive to light.  Cardiovascular:     Rate and Rhythm: Normal rate and regular rhythm.  Pulmonary:      Effort: Pulmonary effort is normal.     Breath sounds: Normal breath sounds. No wheezing or rales.  Neurological:     General: No focal deficit present.     Mental Status: She is alert and oriented to person, place, and time.  Psychiatric:        Mood and Affect: Mood normal.        Behavior: Behavior normal.     UC Treatments / Results  Labs (all labs ordered are listed, but only abnormal results are displayed) Labs Reviewed - No data to display  EKG   Radiology No results found.  Procedures Procedures (including critical care time)  Medications Ordered in UC Medications  ketorolac (TORADOL) injection 60 mg (60 mg Intramuscular Given 08/08/21 1047)    Initial Impression / Assessment and Plan / UC Course  I have reviewed the triage vital signs and the nursing notes.  Pertinent labs & imaging results that were available during my care of the patient were reviewed by me and considered in my medical decision making (see chart for details).    45 year old female presents with headache.  Suspect migraine headache.  Possible pseudotumor cerebri although less likely.  Advised her to have an ophthalmologic exam in the near future.  Toradol given today.  Placing on Imitrex.  Patient requested cough medication.  Promethazine DM was given.  Final Clinical Impressions(s) / UC Diagnoses   Final diagnoses:  Intractable migraine without status migrainosus, unspecified migraine type     Discharge Instructions      This is most consistent with migraine headache.  I would recommend that you see an ophthalmologist in the near future to examine your eyes to assess for signs of pseudotumor cerebri.   Medication as prescribed.  Follow up with your PCP  Take care  Dr. Adriana Simas    ED Prescriptions     Medication Sig Dispense Auth. Provider   SUMAtriptan (IMITREX) 100 MG tablet  (Status: Discontinued) Take at the onset of headache. May take additional dose in 2 hours if headache  persists or recurs. 10 tablet Kristion Holifield G, DO   SUMAtriptan (IMITREX) 100 MG tablet Take at the onset of headache. May take additional dose in 2 hours if headache persists or recurs. 10 tablet Alzora Ha G, DO   promethazine-dextromethorphan (PROMETHAZINE-DM) 6.25-15 MG/5ML syrup Take 5 mLs by mouth 4 (four) times daily as needed for cough. 118 mL Tommie Sams, DO  PDMP not reviewed this encounter.   Tommie Sams, [place] 08/08/21 1138

## 2022-05-07 IMAGING — CT CT HEAD W/O CM
2 series · 15 of 30 positions shown, 17 images · non-contrast
Comparison: No pertinent prior exams available for comparison.

CLINICAL DATA: Headache, new or worsening, neuro deficit.
Additional history provided: Patient reports right-sided severe
headache for 2 days.

EXAM:
CT HEAD WITHOUT CONTRAST
TECHNIQUE: Contiguous axial images were obtained from the base of the skull
through the vertex without intravenous contrast.

[Series 2: head wo · axial · 0.42mm/px · z∈[-106,+14]mm · 7 of 32 slices shown, 9 images]
[im 4/32  brain]
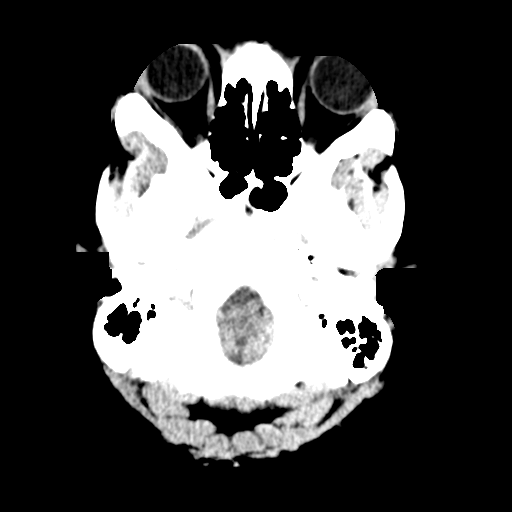
[im 4/32  bone]
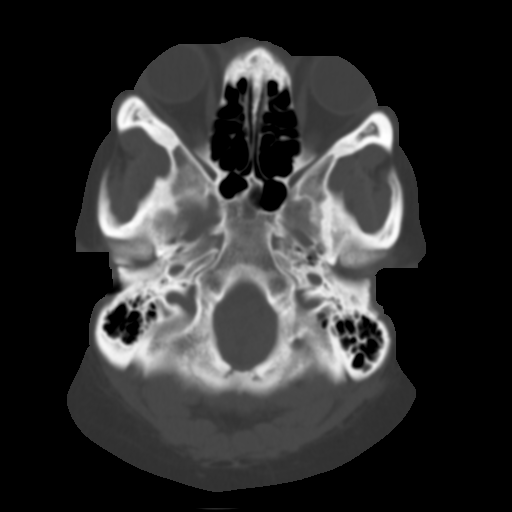
[im 8/32  brain]
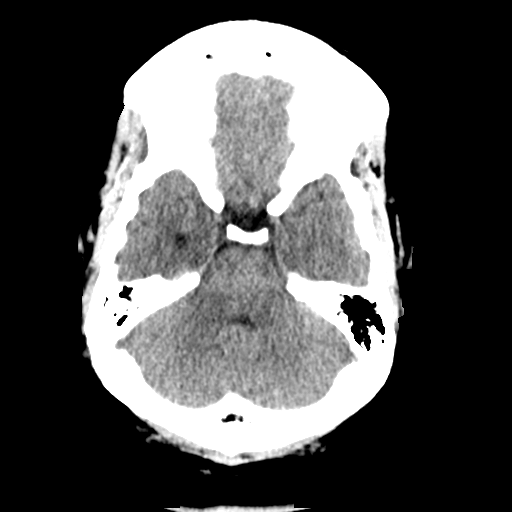
[im 12/32  brain]
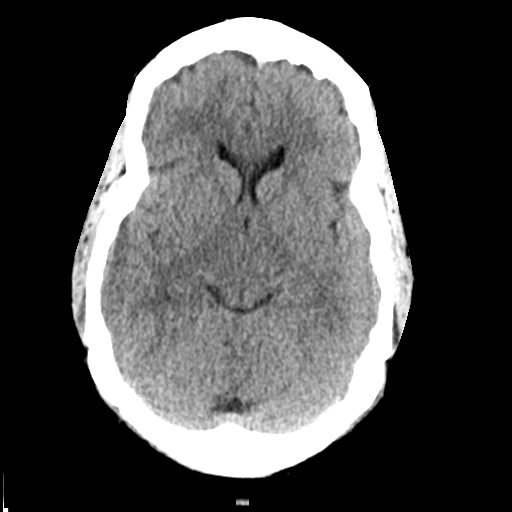
[im 16/32  brain]
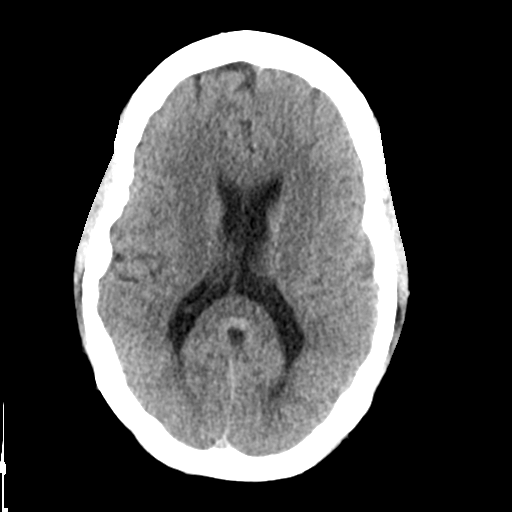
[im 20/32  brain]
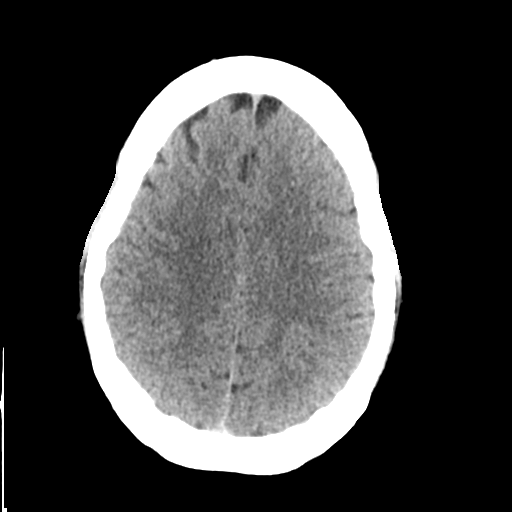
[im 20/32  bone]
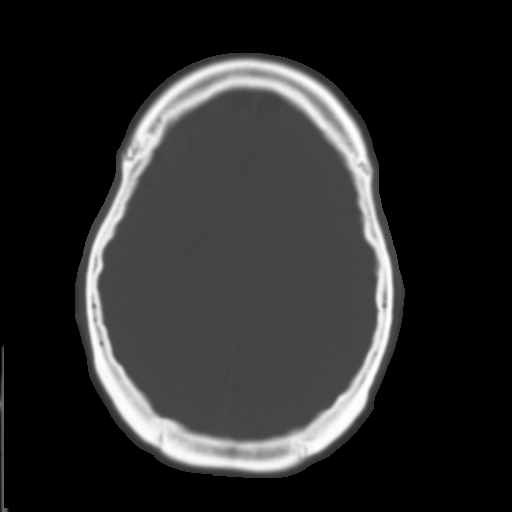
[im 24/32  brain]
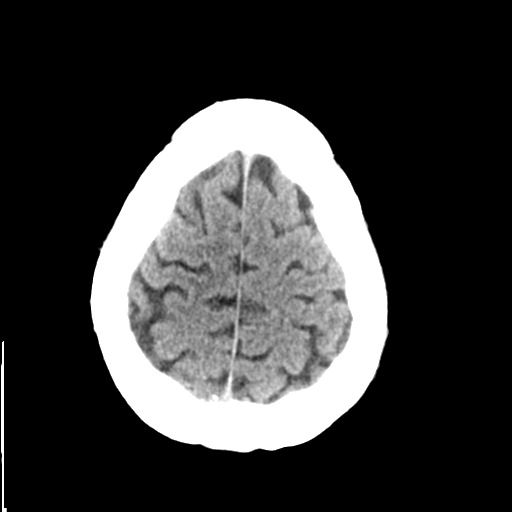
[im 28/32  brain]
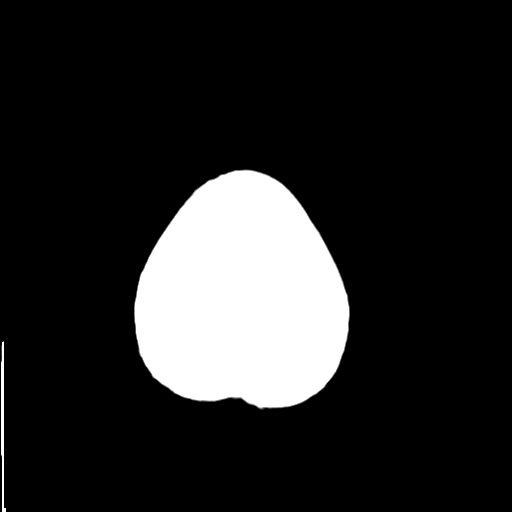

[Series 3: head bone · axial · 0.42mm/px · z∈[-107,+21]mm · 8 of 80 slices shown]
[im 8/80  bone]
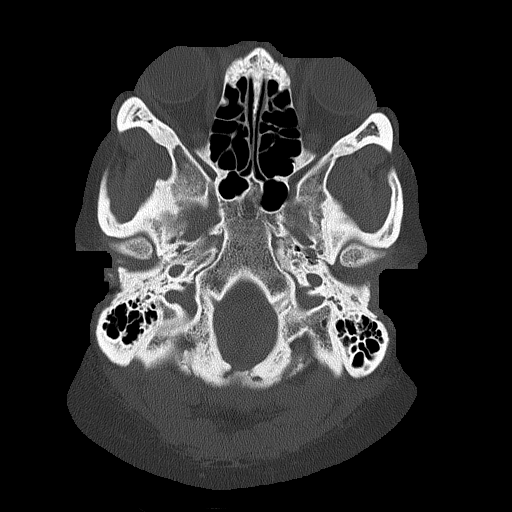
[im 16/80  bone]
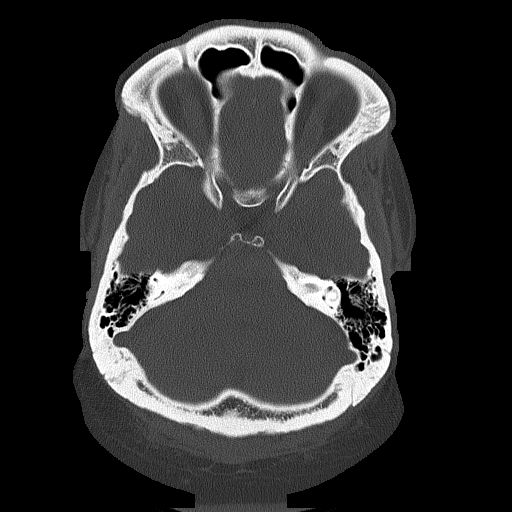
[im 24/80  bone]
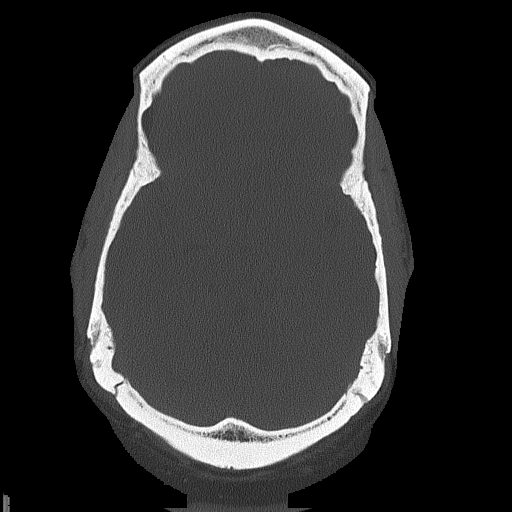
[im 36/80  bone]
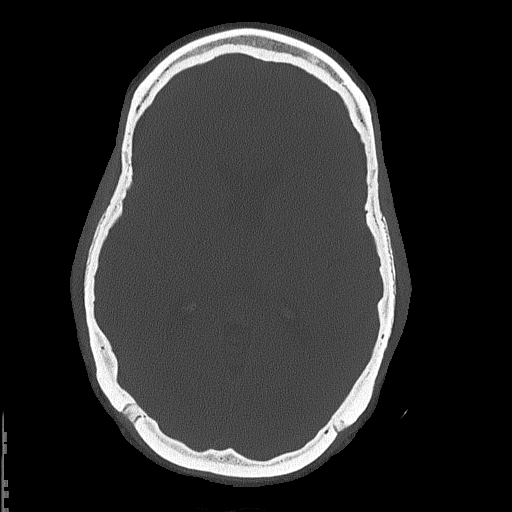
[im 44/80  bone]
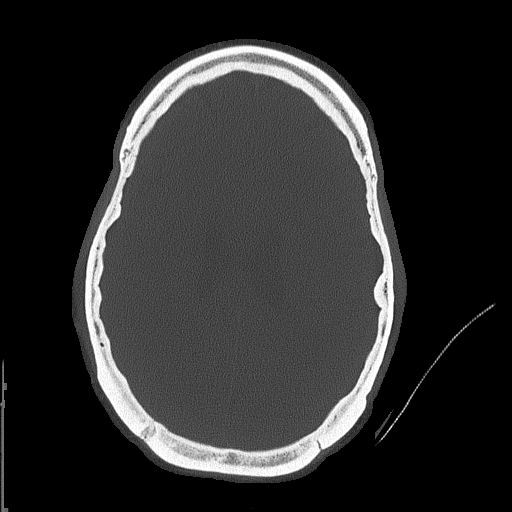
[im 56/80  bone]
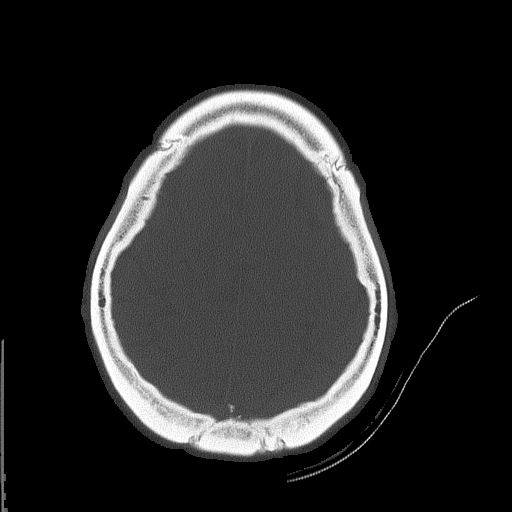
[im 64/80  bone]
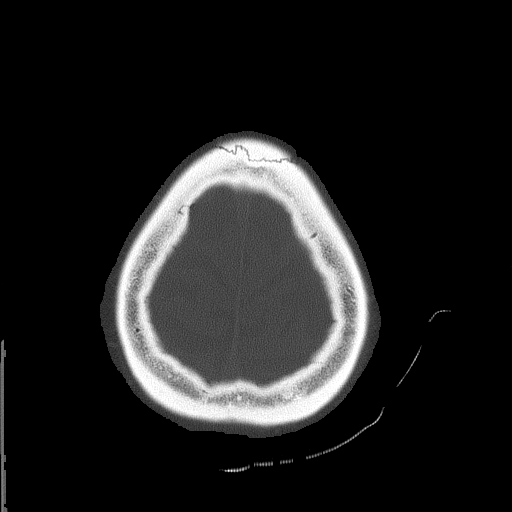
[im 72/80  bone]
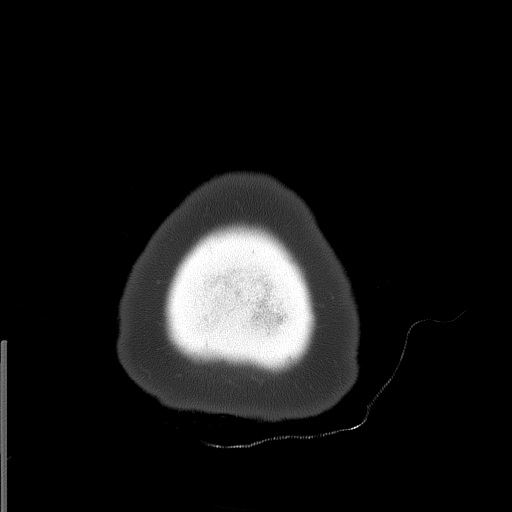

[15 of 30 positions shown; findings below may reference images not displayed]

FINDINGS: Brain:

Cerebral volume is normal.

There is no acute intracranial hemorrhage.

No demarcated cortical infarct.

No extra-axial fluid collection.

No evidence of intracranial mass.

No midline shift.

Partially empty sella turcica.

Vascular: No hyperdense vessel.

Skull: Normal. Negative for fracture or focal lesion.

Sinuses/Orbits: Visualized orbits show no acute finding. Tiny left
ethmoid sinus osteoma. No significant paranasal sinus disease at the
imaged levels.
IMPRESSION: No evidence of acute intracranial abnormality.

Partially empty sella turcica. This finding is very commonly
incidental, but can be associated with idiopathic intracranial
hypertension.

## 2022-09-06 IMAGING — CR DG CHEST 2V
1 series · 2 of 2 positions shown · non-contrast
Comparison: None.

CLINICAL DATA: Cough

EXAM:
CHEST - 2 VIEW

[Series 1: dg chest 2 view · 0.14mm/px · 2 of 2 slices shown]
[im 1/2]
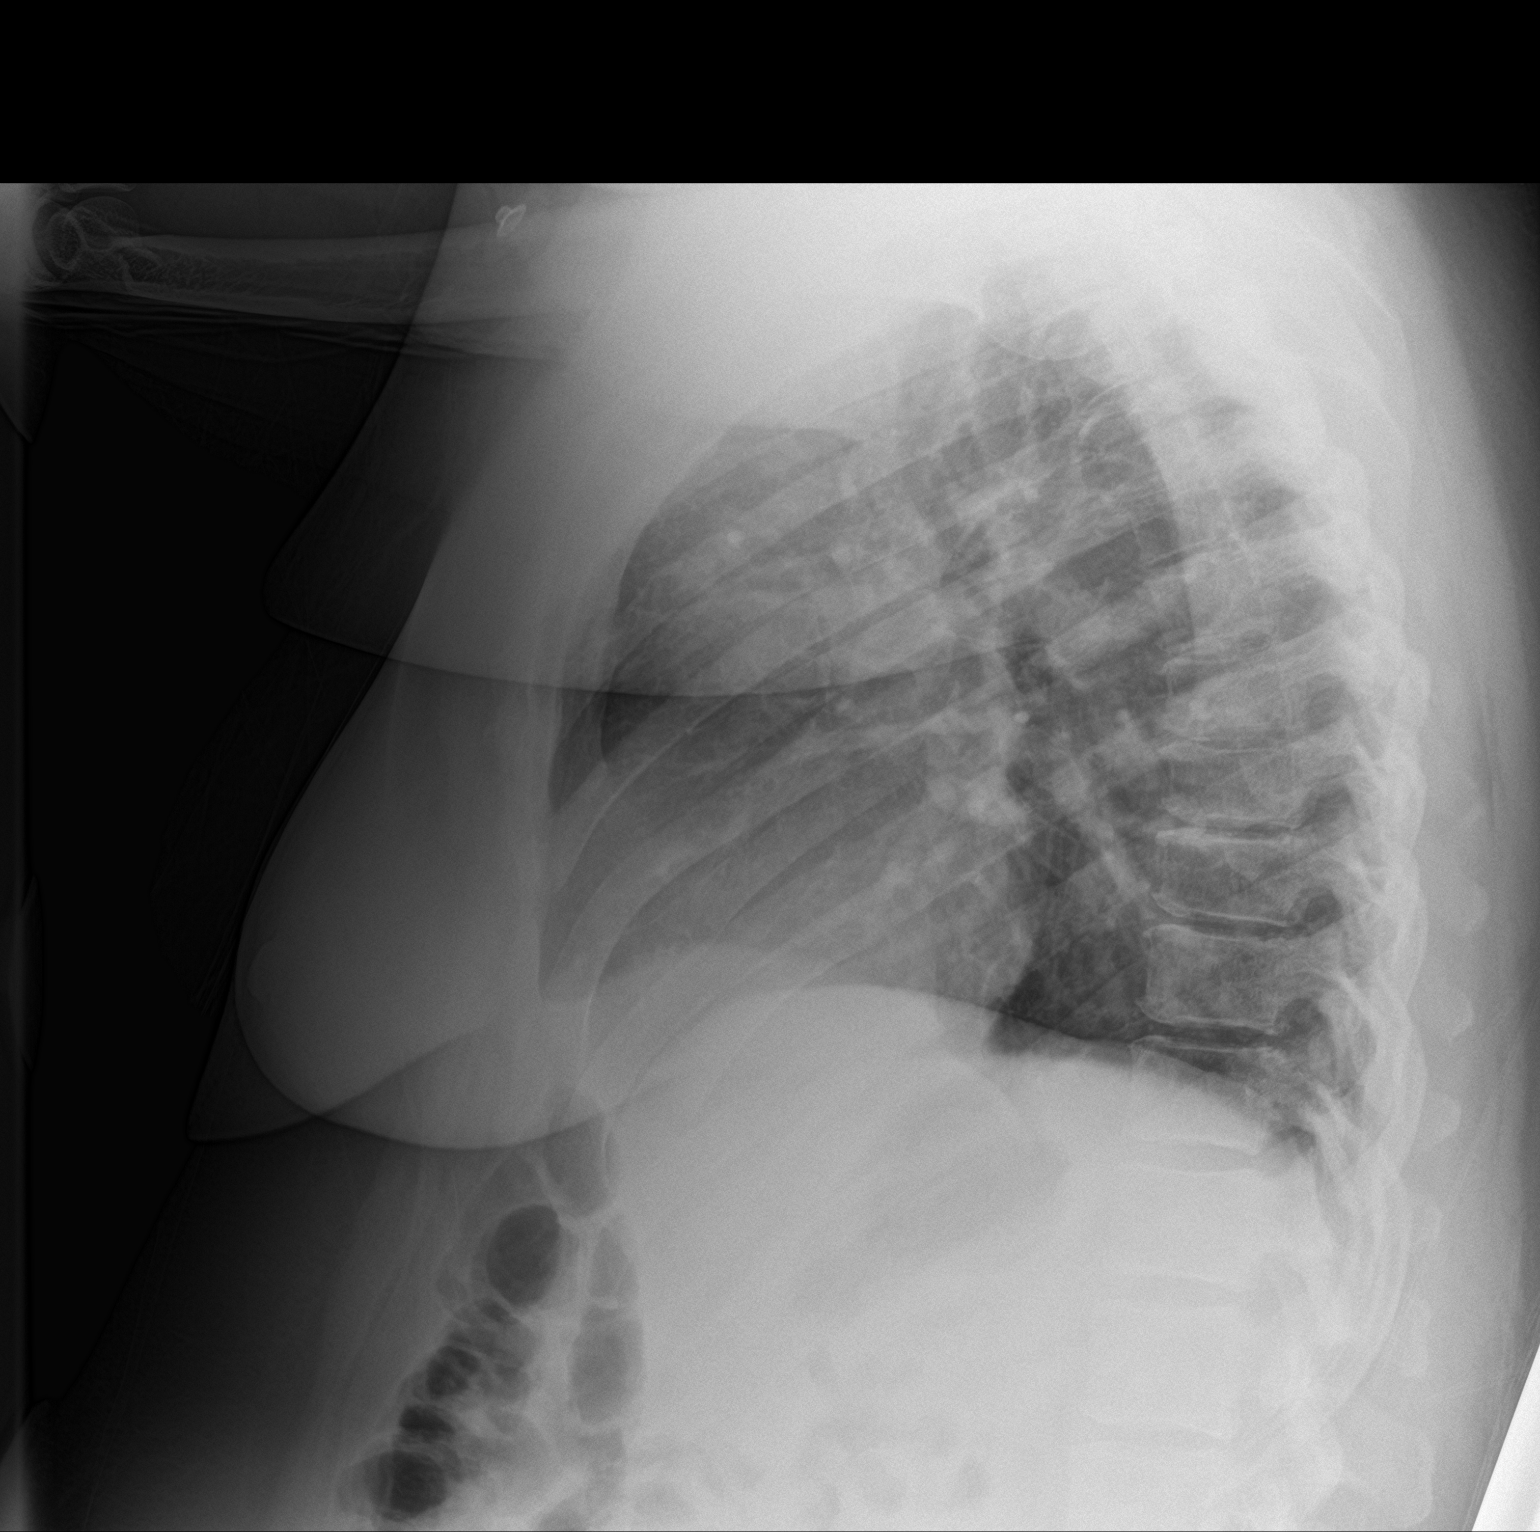
[im 2/2]
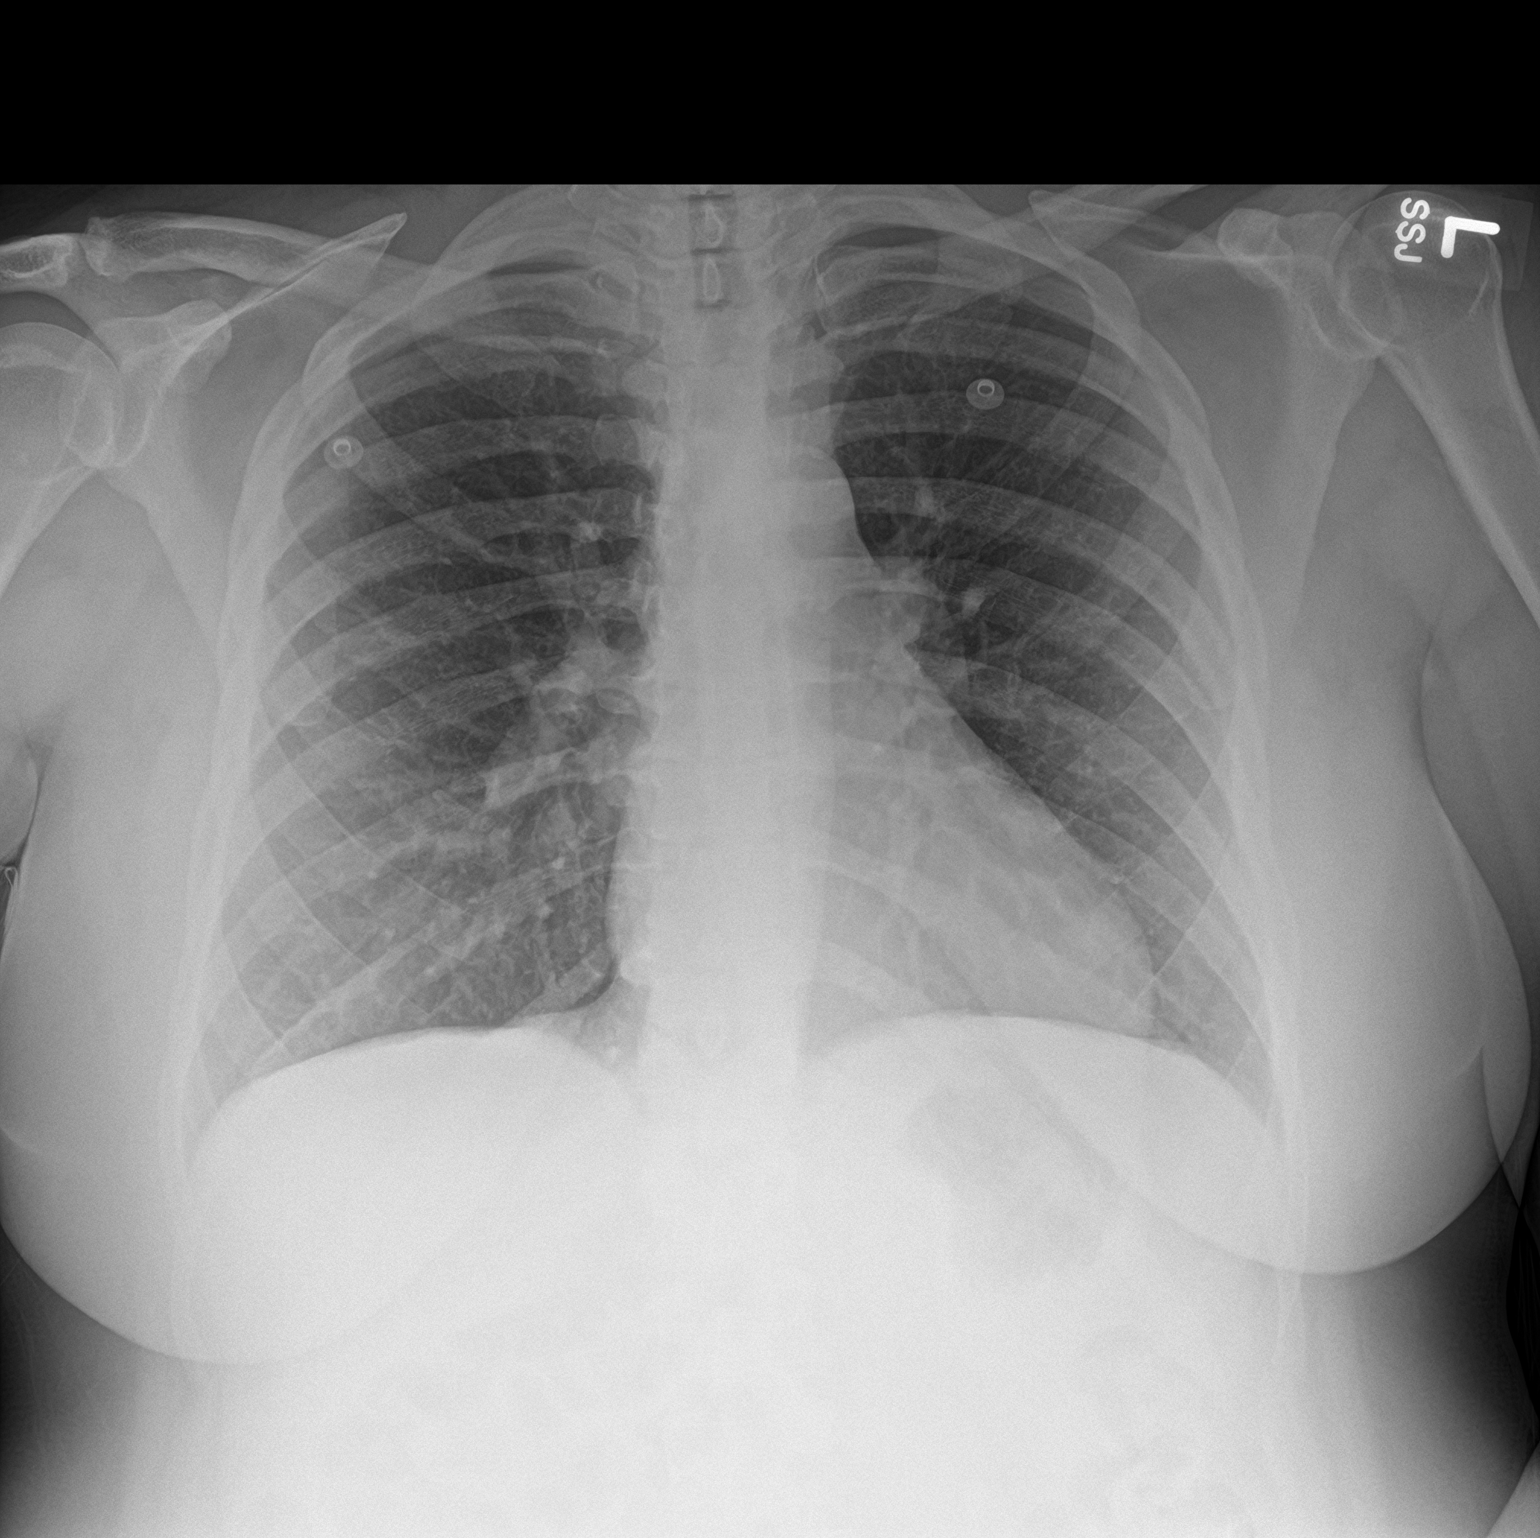

[2 of 2 positions shown; findings below may reference images not displayed]

FINDINGS: Lungs are well expanded, symmetric, and clear. No pneumothorax or
pleural effusion. Cardiac size within normal limits. Pulmonary
vascularity is normal. Osseous structures are age-appropriate. No
acute bone abnormality.
IMPRESSION: No active cardiopulmonary disease.
